# Patient Record
Sex: Female | Born: 2000 | Race: White | Hispanic: No | Marital: Single | Smoking: Never smoker
Health system: Southern US, Community
[De-identification: ages and names within clinical notes are randomized; demographics above are authoritative.]

## PROBLEM LIST (undated history)

## (undated) DIAGNOSIS — F429 Obsessive-compulsive disorder, unspecified: Secondary | ICD-10-CM

## (undated) DIAGNOSIS — F419 Anxiety disorder, unspecified: Secondary | ICD-10-CM

---
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
## 2022-03-10 ENCOUNTER — Other Ambulatory Visit: Payer: Self-pay | Admitting: Nurse Practitioner

## 2022-03-10 NOTE — Telephone Encounter (Signed)
Medication refill request: depo provera  Last AEX:  01/29/21 Next AEX: nothing scheduled  Last MMG (if hormonal medication request): n/a Refill authorized: #1 with 0 rf pended  note sent to pharmacy that patient needs annual before getting any additional refills.

## 2022-06-18 ENCOUNTER — Other Ambulatory Visit: Payer: Self-pay | Admitting: Nurse Practitioner
